# Patient Record
Sex: Female | Born: 1960 | Race: White | Hispanic: No | Smoking: Never smoker
Health system: Southern US, Community
[De-identification: ages and names within clinical notes are randomized; demographics above are authoritative.]

---

## 1999-04-12 ENCOUNTER — Other Ambulatory Visit: Admission: RE | Admit: 1999-04-12 | Discharge: 1999-04-12 | Payer: Self-pay | Admitting: Obstetrics and Gynecology

## 2000-06-19 ENCOUNTER — Other Ambulatory Visit: Admission: RE | Admit: 2000-06-19 | Discharge: 2000-06-19 | Payer: Self-pay | Admitting: Obstetrics and Gynecology

## 2001-07-06 ENCOUNTER — Other Ambulatory Visit: Admission: RE | Admit: 2001-07-06 | Discharge: 2001-07-06 | Payer: Self-pay | Admitting: Obstetrics and Gynecology

## 2002-07-15 ENCOUNTER — Encounter: Payer: Self-pay | Admitting: Obstetrics and Gynecology

## 2002-07-15 ENCOUNTER — Encounter: Admission: RE | Admit: 2002-07-15 | Discharge: 2002-07-15 | Payer: Self-pay | Admitting: Obstetrics and Gynecology

## 2002-07-19 ENCOUNTER — Other Ambulatory Visit: Admission: RE | Admit: 2002-07-19 | Discharge: 2002-07-19 | Payer: Self-pay | Admitting: Obstetrics and Gynecology

## 2002-07-22 ENCOUNTER — Ambulatory Visit (HOSPITAL_COMMUNITY): Admission: RE | Admit: 2002-07-22 | Discharge: 2002-07-22 | Payer: Self-pay | Admitting: Obstetrics and Gynecology

## 2002-07-22 ENCOUNTER — Encounter: Payer: Self-pay | Admitting: Obstetrics and Gynecology

## 2002-07-26 ENCOUNTER — Ambulatory Visit: Admission: RE | Admit: 2002-07-26 | Discharge: 2002-07-26 | Payer: Self-pay | Admitting: Gynecology

## 2002-08-09 ENCOUNTER — Encounter (INDEPENDENT_AMBULATORY_CARE_PROVIDER_SITE_OTHER): Payer: Self-pay

## 2002-08-09 ENCOUNTER — Encounter (INDEPENDENT_AMBULATORY_CARE_PROVIDER_SITE_OTHER): Payer: Self-pay | Admitting: Specialist

## 2002-08-09 ENCOUNTER — Inpatient Hospital Stay (HOSPITAL_COMMUNITY): Admission: RE | Admit: 2002-08-09 | Discharge: 2002-08-11 | Payer: Self-pay | Admitting: Obstetrics and Gynecology

## 2003-12-05 ENCOUNTER — Encounter: Admission: RE | Admit: 2003-12-05 | Discharge: 2003-12-05 | Payer: Self-pay | Admitting: Obstetrics and Gynecology

## 2003-12-08 ENCOUNTER — Other Ambulatory Visit: Admission: RE | Admit: 2003-12-08 | Discharge: 2003-12-08 | Payer: Self-pay | Admitting: Obstetrics and Gynecology

## 2005-04-25 ENCOUNTER — Encounter: Admission: RE | Admit: 2005-04-25 | Discharge: 2005-04-25 | Payer: Self-pay | Admitting: Obstetrics and Gynecology

## 2006-05-25 ENCOUNTER — Encounter: Admission: RE | Admit: 2006-05-25 | Discharge: 2006-05-25 | Payer: Self-pay | Admitting: Obstetrics and Gynecology

## 2007-07-12 ENCOUNTER — Encounter: Admission: RE | Admit: 2007-07-12 | Discharge: 2007-07-12 | Payer: Self-pay | Admitting: Obstetrics and Gynecology

## 2008-07-17 ENCOUNTER — Encounter: Admission: RE | Admit: 2008-07-17 | Discharge: 2008-07-17 | Payer: Self-pay | Admitting: Obstetrics and Gynecology

## 2009-08-09 ENCOUNTER — Encounter: Admission: RE | Admit: 2009-08-09 | Discharge: 2009-08-09 | Payer: Self-pay | Admitting: Obstetrics and Gynecology

## 2010-02-25 ENCOUNTER — Encounter: Payer: Self-pay | Admitting: Obstetrics and Gynecology

## 2010-03-01 ENCOUNTER — Other Ambulatory Visit: Payer: Self-pay | Admitting: Obstetrics and Gynecology

## 2010-03-01 DIAGNOSIS — M8580 Other specified disorders of bone density and structure, unspecified site: Secondary | ICD-10-CM

## 2010-03-01 DIAGNOSIS — Z1239 Encounter for other screening for malignant neoplasm of breast: Secondary | ICD-10-CM

## 2010-03-08 ENCOUNTER — Other Ambulatory Visit: Payer: Self-pay | Admitting: Obstetrics and Gynecology

## 2010-06-21 NOTE — Discharge Summary (Signed)
   NAME:  Leslie Freeman, Leslie Freeman                        ACCOUNT NO.:  [id number]   MEDICAL RECORD NO.:  [id number]                   PATIENT TYPE:  INP   LOCATION:  9106                                 FACILITY:  [facility]   PHYSICIAN:  Michelle L. Vincente Poli, M.D.            DATE OF BIRTH:  1960/06/21   DATE OF ADMISSION:  08/09/2002  DATE OF DISCHARGE:  08/11/2002                                 DISCHARGE SUMMARY   ADMISSION DIAGNOSES:  1. Fibroid uterus.  2. Pelvic mass.   POSTOPERATIVE DIAGNOSES:  1. Fibroid uterus.  2. Pelvic mass.   HISTORY OF PRESENT ILLNESS:  The patient is a 50 year old gravida 0 who  presents for TAH and possible BSO. The patient was noted to have a large  pelvic mass at the time of annual examination. CA125 was within normal  limits. CT scan showed a large pedunculated fibroid.   HOSPITAL COURSE:  On the day of surgery the patient went to the operating  room. At the time of laparotomy, she was found to have extensive adhesions  with extensive large myomatous uterus that was degenerating into the left  pelvic sidewall. She had a TAH, lysis of adhesions, and LSO. EBL at the time  of surgery was 1600 cc. The patient did very well postoperative.   LABORATORY DATA:  On postoperative day 1, hemoglobin was 9.7 and hematocrit  was 28.6. White blood cell count was 11.6.   DISPOSITION:  She was discharged home on postoperative day 2 in good  condition.   DISCHARGE MEDICATIONS:  She was given Ibuprofen 600 mg q. 6 to take as  needed for pain as well as Tylox to take 1-2 every 4-6 hours for pain.   DISCHARGE INSTRUCTIONS:  She was given advice not to drive for 1 week. To  call if she has nausea, vomiting, temperature greater than 100.5, redness or  drainage from the incision site.   FOLLOW UP:  She will followup in 1 week.                                               Michelle L. Vincente Poli, M.D.    Florestine Avers  D:  09/05/2002  T:  09/05/2002  Job:  [id number]

## 2010-06-21 NOTE — Op Note (Signed)
NAME:  Leslie Freeman, Leslie Freeman                        ACCOUNT NO.:  [id number]   MEDICAL RECORD NO.:  [id number]                   PATIENT TYPE:  INP   LOCATION:  9106                                 FACILITY:  [facility]   PHYSICIAN:  Michelle L. Vincente Poli, M.D.            DATE OF BIRTH:  1960/04/01   DATE OF PROCEDURE:  08/09/2002  DATE OF DISCHARGE:  08/11/2002                                 OPERATIVE REPORT   PREOPERATIVE DIAGNOSIS:  Symptomatic fibroid and large pelvic mass.   POSTOPERATIVE DIAGNOSIS:  Symptomatic fibroid and large pelvic mass.   PROCEDURE:  Exploratory laparotomy, lysis of adhesions, total abdominal  hysterectomy, and left salpingo-oophorectomy.   SURGEON:  Michelle L. Vincente Poli, M.D.   ASSISTANT:  Raynald Kemp, M.D.   ANESTHESIA:  General.   ESTIMATED BLOOD LOSS:  1600 mL.   DRAINS:  Foley.   PATHOLOGY:  Uterus, cervix, left tube, and ovary.   PROCEDURE:  The patient was taken to the operating room.  She was intubated  in the standard fashion without difficulty.  The abdomen was prepped and  draped in the usual sterile fashion.  A Foley catheter was inserted into the  bladder.  A sterile drape was applied to the abdomen.  A low transverse  incision was made, carried down to the fascia.  The fascia was scored in the  midline and extended laterally.  The rectus muscles were separated in the  midline.  The peritoneum was entered bluntly and the peritoneal incision was  then extended.  Pelvic washings were then performed, and abdominal pelvic  exploration was then performed, which revealed an extremely large uterus.  It encompassed the entire lower pelvis, and it went from one sidewall to the  next.  It also extended up to her umbilicus.  It appeared grossly consistent  with a very large fibroid.  There were some adhesions on the left side of  the bowel to the posterior wall of the uterus, and actually I believe those  adhesions had twisted the uterus so that the left  tube and ovary were on the  right side.  We then extended our incision laterally with dissecting the  rectus muscles partially to get some good lateral exposure and then replaced  the towel clamps onto the uterus.  We were unable to actually elevate the  uterus through the incision.  We proceeded with using Metzenbaums and  carefully dissecting the bowel free posteriorly.  There was some increased  mobility on the right side. on the right side of the pelvis.  The right tube  and ovary were visualized.  The triple pedicle was then clamped using the  Kelly clamp and specimen was cut using a suture ligature of 0 Vicryl suture.  We then did develop the bladder flap a little on the right side.  However,  we were unable to salvage the left tube and ovary because it was stretched  and splayed across this mass.  So using very careful clamps, Kelly clamps  were placed just beneath this fibroid on the left side to mobilize the broad  ligament.  The specimen was clamped, suture ligated using 0 Vicryl suture.   We then at this point after the lysis of adhesions, there was still  extremely limited mobility.  We were unable to actually get down and see her  uterines very well on the left side.  However, after dissecting the bladder  down on the right side, I was able to clamp the uterine artery at the level  of the internal os, and after the pedicle was cut, suture ligated using 0  Vicryl suture.   We decided to bivalve the uterus to collapse it because it was so wide.  That way we could get mobility laterally.  So using this scalpel, a large V-  shaped incision was made in the uterus from the fundus down to the mid  portion of the uterine body and a large portion of the center of the uterus  was then removed.  This allowed the uterus to be collapsed in the midline so  we could see laterally.  At this point we then developed our bladder flap on  the left side and a curved Heaney clamp was placed right  at the internal os  on the left side.  The specimen was then removed using a knife after the  fundus was amputated and the uterine artery pedicle was then suture ligated  using 0 Vicryl suture.  Because the fibroid had appeared to grow into the  wall and on the left side in the pelvic sidewall and showed signs of  degeneration, the ureter on the left side was extremely adherent to the  lateral wall of the uterus.  After we amputated the fundus and removed the  uterus, the ureter was identified and was very close to our specimen.  However, indigo carmine was given at this point and there was no spillage of  any blue dye intraperitoneally or retroperitoneal, and there was notation  that the urine turned blue.  There was also normal peristalsis of both  ureters, and neither ureter was dilated throughout the entire case.  We felt  very confident that our ureter was fine.   After the uterus was amputated, that portion of the specimen was sent for  frozen section and the pathologist's reading and interpretation of the  frozen section was benign leiomyoma with cystic degeneration.  After the  uterus was amputated, we then proceeded with removal of the cervix and we  dissected the bladder free from the anterior surface of the cervix, and then  using a series of straight Heaney clamps, the Heaney clamps were placed just  beside the cervix and the specimen was cut.  The pedicles were cut and  suture ligated using 0 Vicryl suture.  Once we worked ourselves down to the  external os, a curved Heaney clamp was placed just beneath it.  The specimen  was removed using the Satinsky scissors and the cuff was then closed in a  series of figure-of-eights using 0 Vicryl suture.  Irrigation was performed.  All pedicles were inspected.  Hemostasis was noted.  Again, our right tube  and ovary appeared normal, and both ureters were reinspected in their entirety and they were noted to be normal with normal  peristalsis and no  evidence of dilation.  All instruments were removed from the abdominal  cavity.  The peritoneum was closed using 0 Vicryl continuous running stitch.  The rectus muscles were reapproximated in the same 0 Vicryl.  The fascia was  closed using an 0 Vicryl suture in a continuous running suture starting at  each corner and meeting in the midline.  After irrigation of the  subcutaneous layer, the skin was closed with staples.  All sponge, lap, and  instrument counts were correct x 2.  The patient tolerated the procedure  well and went to the recovery room in stable condition.                                               Michelle L. Vincente Poli, M.D.    Florestine Avers  D:  08/11/2002  T:  08/11/2002  Job:  [id number]

## 2010-06-21 NOTE — Consult Note (Signed)
NAME:  Leslie Freeman, BARCIA                        ACCOUNT NO.:  [id number]   MEDICAL RECORD NO.:  [id number]                   PATIENT TYPE:  OUT   LOCATION:  GYN                                  FACILITY:  [facility]   PHYSICIAN:  De Blanch, M.D.         DATE OF BIRTH:  06-Aug-1960   DATE OF CONSULTATION:  07/26/2002  DATE OF DISCHARGE:                                   CONSULTATION   REASON FOR CONSULTATION:  The patient is a 50 year old white married female  seen in consultation at the request of Dr. Marcelle Overlie regarding newly  diagnosed pelvic mass.   The mass is relatively asymptomatic, the patient reporting that she  discovered it after she lost 55 pounds over the past year while  participating in a [employer] weight reduction program.  She has  regular cyclic menstrual periods every 25 days, she has no intermenstrual  spotting or postcoital bleeding, and she has no pelvic pain, pressure, or GI  or GU symptoms.   PAST MEDICAL HISTORY:  The patient has no significant gynecologic history.   OBSTETRICAL HISTORY:  Gravida 0.   PAST SURGICAL HISTORY:  None.   ALLERGIES:  None.   SOCIAL HISTORY:  The patient is married.  She does not smoke.  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   REVIEW OF SYSTEMS:  Negative, except as noted above.   PHYSICAL EXAMINATION:  VITAL SIGNS:  Weight 168 pounds, height 5 feet 8  inches.  Blood pressure 122/76, pulse 98, respirations 18.  GENERAL:  The patient is a healthy young woman in no acute distress.  HEENT:  Negative.  NECK:  Supple without thyromegaly.  There is no supraclavicular or inguinal  adenopathy.  ABDOMEN:  Soft and nontender.  She has a mass extending from the pelvis up  to approximately the umbilicus.  This is firm and nontender.  No ascites is  noted.  PELVIC:  EGBUS, vagina, bladder, and urethra are normal.  Cervix is normal.  Bimanual examination reveals an enlarged uterus approximately [redacted] weeks  gestational size.  This is moderately mobile and nontender.  No adnexal  masses are palpable.   LABORATORY DATA:  Laboratory work reviewed.  CA-125 8.7.  CT scan obtained  on June 18k 2004, showed no evidence for abnormality in the abdomen.  In the  pelvis, there is a mass on the central pelvis measuring 13 x 11 x 14 cm.  It  has blood flow that is contiguous with the uterus, making it likely that  this is a pedunculated uterine fibroid.  Both ovaries were identified  separate from the mass.   IMPRESSION:  Probable uterine fibroids which are relatively asymptomatic.  Nonetheless, given their size, I recommend the patient undergo a total  abdominal hysterectomy.  If her ovaries appear normal, I believe they could  be spared.  We did discuss myomectomy, but in the end the patient does not  wish to have any children, and therefore, hysterectomy would seem to be the  most appropriate management option.   PLAN:  I will discuss the patient's case with Dr. Vincente Poli.  I honestly  believe that these are fibroids and that Dr. Vincente Poli could proceed with  surgery as is convenient to her schedule.                                               De Blanch, M.D.    DC/MEDQ  D:  07/26/2002  T:  07/26/2002  Job:  [id number]   cc:   Marcelino Duster L. Vincente Poli, M.D.  [address]  [place]  [place] [address]  Fax: [phone]   Telford Nab, R.N.

## 2010-09-06 ENCOUNTER — Other Ambulatory Visit: Payer: Self-pay | Admitting: Obstetrics and Gynecology

## 2010-09-06 DIAGNOSIS — Z1231 Encounter for screening mammogram for malignant neoplasm of breast: Secondary | ICD-10-CM

## 2010-09-09 ENCOUNTER — Ambulatory Visit
Admission: RE | Admit: 2010-09-09 | Discharge: 2010-09-09 | Disposition: A | Discharge status: Home / Self Care | Payer: No Typology Code available for payment source | Source: Ambulatory Visit | Attending: Obstetrics and Gynecology | Admitting: Obstetrics and Gynecology

## 2010-09-09 DIAGNOSIS — Z1231 Encounter for screening mammogram for malignant neoplasm of breast: Secondary | ICD-10-CM

## 2011-03-06 ENCOUNTER — Ambulatory Visit
Admission: RE | Admit: 2011-03-06 | Discharge: 2011-03-06 | Disposition: A | Discharge status: Home / Self Care | Payer: No Typology Code available for payment source | Source: Ambulatory Visit | Attending: Emergency Medicine | Admitting: Emergency Medicine

## 2011-03-06 ENCOUNTER — Ambulatory Visit (INDEPENDENT_AMBULATORY_CARE_PROVIDER_SITE_OTHER): Payer: No Typology Code available for payment source | Admitting: Emergency Medicine

## 2011-03-06 ENCOUNTER — Encounter: Payer: Self-pay | Admitting: Emergency Medicine

## 2011-03-06 VITALS — BP 119/85 | HR 105 | Temp 98.5°F | Resp 18 | Ht 68.0 in | Wt 186.0 lb

## 2011-03-06 DIAGNOSIS — G510 Bell's palsy: Secondary | ICD-10-CM

## 2011-03-06 DIAGNOSIS — E119 Type 2 diabetes mellitus without complications: Secondary | ICD-10-CM | POA: Insufficient documentation

## 2011-03-06 MED ORDER — PREDNISONE 20 MG PO TABS: ORAL_TABLET | ORAL | Status: DC

## 2011-03-06 MED ORDER — ARTIFICIAL TEARS OP OINT: TOPICAL_OINTMENT | OPHTHALMIC | Status: DC

## 2011-03-06 MED ORDER — VALACYCLOVIR HCL 1 G PO TABS: 1000.0000 mg | ORAL_TABLET | Freq: Two times a day (BID) | ORAL | Status: AC

## 2011-03-06 NOTE — Patient Instructions (Addendum)
Bell's Palsy Bell's palsy is a condition in which the muscles on one side of the face cannot move (paralysis). This is because the nerves in the face are paralyzed. It is most often thought to be caused by a virus. The virus causes swelling of the nerve that controls movement on one side of the face. The nerve travels through a tight space surrounded by bone. When the nerve swells, it can be compressed by the bone. This results in damage to the protective covering around the nerve. This damage interferes with how the nerve communicates with the muscles of the face. As a result, it can cause weakness or paralysis of the facial muscles.  Injury (trauma), tumor, and surgery may cause Bell's palsy, but most of the time the cause is unknown. It is a relatively common condition. It starts suddenly (abrupt onset) with the paralysis usually ending within 2 days. Bell's palsy is not dangerous. But because the eye does not close properly, you may need care to keep the eye from getting dry. This can include splinting (to keep the eye shut) or moistening with artificial tears. Bell's palsy very seldom occurs on both sides of the face at the same time. SYMPTOMS   Eyebrow sagging.   Drooping of the eyelid and corner of the mouth.   Inability to close one eye.   Loss of taste on the front of the tongue.   Sensitivity to loud noises.  TREATMENT  The treatment is usually non-surgical. If the patient is seen within the first 24 to 48 hours, a short course of steroids may be prescribed, in an attempt to shorten the length of the condition. Antiviral medicines may also be used with the steroids, but it is unclear if they are helpful.  You will need to protect your eye, if you cannot close it. The cornea (clear covering over your eye) will become dry and can be damaged. Artificial tears can be used to keep your eye moist. Glasses or an eye patch should be worn to protect your eye. PROGNOSIS  Recovery is variable,  ranging from days to months. Although the problem usually goes away completely (about 80% of cases resolve), predicting the outcome is impossible. Most people improve within 3 weeks of when the symptoms began. Improvement may continue for 3 to 6 months. A small number of people have moderate to severe weakness that is permanent.  HOME CARE INSTRUCTIONS   If your caregiver prescribed medication to reduce swelling in the nerve, use as directed. Do not stop taking the medication unless directed by your caregiver.   Use moisturizing eye drops as needed to prevent drying of your eye, as directed by your caregiver.   Protect your eye, as directed by your caregiver.   Use facial massage and exercises, as directed by your caregiver.   Perform your normal activities, and get your normal rest.  SEEK IMMEDIATE MEDICAL CARE IF:   There is pain, redness or irritation in the eye.   You or your child has an oral temperature above 102 F (38.9 C), not controlled by medicine.  MAKE SURE YOU:   Understand these instructions.   Will watch your condition.   Will get help right away if you are not doing well or get worse.  Document Released: 01/20/2005 Document Revised: 10/02/2010 Document Reviewed: 01/29/2009 [employer] Patient Information 2012 [employer]. 

## 2011-03-06 NOTE — Progress Notes (Addendum)
  Subjective:    Patient ID: Leslie Freeman, female    DOB: 11-13-1960, 51 y.o.   MRN: [id number]  HPI  Patient is a 51 year old female with history of diabetes.  She comes in complaining of weakness and numbness on the left side of her face.  She was worried that she was having a stroke.  The patient claims that at 7:20 this morning she was brushing her teeth and noticed herself drooling out of the left side of her mouth.  While looking in the mirror she also noticed that she could not properly smile.  The patient works at the [employer] of [place] and Dr. Mayford Knife recommended that she seek medical attention to rule out a stroke.    Review of Systems  HENT: Negative for hearing loss, ear pain, facial swelling, neck pain, neck stiffness and ear discharge.   Eyes: Negative for visual disturbance.  Cardiovascular: Negative for chest pain and palpitations.  Neurological: Positive for facial asymmetry, weakness and numbness. Negative for dizziness, tremors, syncope, speech difficulty, light-headedness and headaches.  Psychiatric/Behavioral: Negative for behavioral problems and confusion.   No speech abnormalities No diplopia or visual disturbances  Numbness on inside of left cheek      Objective:   Physical Exam  HENT:  Head: Normocephalic.  Right Ear: External ear normal.  Mouth/Throat: Oropharynx is clear and moist.  Eyes: Conjunctivae are normal. Pupils are equal, round, and reactive to light.  Neck: Normal range of motion.  Cardiovascular: Normal rate and normal heart sounds.   Pulmonary/Chest: Effort normal and breath sounds normal.  Neurological: She has normal reflexes. She displays normal reflexes. A cranial nerve deficit is present. She exhibits normal muscle tone. Coordination normal.       Left facial paralysis.  Abnormal taste sensation, left side of tongue.  Other cranial nerves appear normal.     CT normal       Assessment & Plan:  Bells Palsy (rule out stroke).   Will proceed with CT head, no contrast.  Working on approval.

## 2011-03-09 ENCOUNTER — Ambulatory Visit (INDEPENDENT_AMBULATORY_CARE_PROVIDER_SITE_OTHER): Payer: No Typology Code available for payment source | Admitting: Emergency Medicine

## 2011-03-09 VITALS — BP 120/78 | HR 97 | Temp 98.3°F | Resp 16 | Ht 67.0 in | Wt 185.6 lb

## 2011-03-09 DIAGNOSIS — G510 Bell's palsy: Secondary | ICD-10-CM

## 2011-03-09 NOTE — Progress Notes (Signed)
  Subjective:    Patient ID: Leslie Freeman, female    DOB: 1960/06/30, 51 y.o.   MRN: [id number]  HPI patient presents for followup of Bell's palsy. She is tolerating her prednisone without elevation in her blood sugar. She has not had any major problems with closure of the left upper lid. She seems to be tolerating the medications prednisone and ganciclovir without difficulty.    Review of Systems overall sugar diabetes is doing well.     Objective:   Physical Exam physical exam is limited to the neurological system which reveals a left seventh nerve palsy. There appears to be some movement of the left fore head. No other focal neurological sign        Assessment & Plan:  Plan is for the patient to continue her medications. She will continues Lacri-Lube to keep her eye moist. She is to return to clinic immediately for any worsening neurological symptoms. If she continues to do well plan recheck in 2 weeks get a better idea about the speed of improvement.

## 2011-03-09 NOTE — Patient Instructions (Signed)
Patient will return to clinic for any worsening of neurological symptoms we'll recheck in 2 weeks and check on progress of her improvement

## 2011-03-22 ENCOUNTER — Ambulatory Visit (INDEPENDENT_AMBULATORY_CARE_PROVIDER_SITE_OTHER): Payer: No Typology Code available for payment source | Admitting: Emergency Medicine

## 2011-03-22 VITALS — BP 105/63 | HR 68 | Temp 99.1°F | Resp 16 | Ht 67.5 in | Wt 182.0 lb

## 2011-03-22 DIAGNOSIS — G510 Bell's palsy: Secondary | ICD-10-CM

## 2011-03-22 DIAGNOSIS — G44209 Tension-type headache, unspecified, not intractable: Secondary | ICD-10-CM

## 2011-03-22 NOTE — Progress Notes (Signed)
  Subjective:    Patient ID: Leslie Freeman, female    DOB: May 29, 1960, 51 y.o.   MRN: [id number]  HPI patient is here to followup Bell's palsy. Initially she had CT scan of her head and this was within normal limits. She was treated with prednisone and Valtrex with good response. She continues to use drops in her. Since her last visit she has had marked improvement in her facial muscle function    Review of Systems she has had no other neurological symptoms.     Objective:   Physical Exam on neurological examination she has had marked improvement in her ability to wrinkle her forehead. She has had an approximately 50% return of her ability to smile. She is now able to purse her lips. She has almost complete closure of her left upper lip.        Assessment & Plan:  Assessment is of a left Bell's palsy. Plan recheck in about one month.

## 2011-04-19 ENCOUNTER — Ambulatory Visit (INDEPENDENT_AMBULATORY_CARE_PROVIDER_SITE_OTHER): Payer: No Typology Code available for payment source | Admitting: Emergency Medicine

## 2011-04-19 VITALS — BP 115/74 | HR 66 | Temp 98.4°F | Resp 16 | Ht 67.5 in | Wt 176.6 lb

## 2011-04-19 DIAGNOSIS — G510 Bell's palsy: Secondary | ICD-10-CM

## 2011-04-19 NOTE — Progress Notes (Signed)
  Subjective:    Patient ID: Leslie Freeman, female    DOB: 08/31/1960, 51 y.o.   MRN: [id number]  HPI patient here to follow up Bell's palsy. She's been incredibly better. He is now able to close her eye completely. Her smile is returned to being symmetrical.    Review of Systems review of systems is noncontributory.     Objective:   Physical Exam physical exam of the cranial nerves of the face revealed completely symmetrical facial expression. She has normal pursing of her lips. She has normal eye closure.        Assessment & Plan:  Assessment is Bell's palsy. Total resolution of symptoms .

## 2011-10-20 ENCOUNTER — Other Ambulatory Visit: Payer: Self-pay | Admitting: Obstetrics and Gynecology

## 2011-10-20 DIAGNOSIS — Z1231 Encounter for screening mammogram for malignant neoplasm of breast: Secondary | ICD-10-CM

## 2011-11-03 ENCOUNTER — Ambulatory Visit: Payer: No Typology Code available for payment source | Admitting: Family Medicine

## 2011-11-03 VITALS — BP 112/74 | HR 120 | Temp 98.1°F | Resp 18 | Ht 67.0 in | Wt 175.0 lb

## 2011-11-03 DIAGNOSIS — G510 Bell's palsy: Secondary | ICD-10-CM

## 2011-11-03 DIAGNOSIS — E119 Type 2 diabetes mellitus without complications: Secondary | ICD-10-CM

## 2011-11-03 MED ORDER — PREDNISONE 20 MG PO TABS: ORAL_TABLET | ORAL | Status: DC

## 2011-11-03 MED ORDER — ARTIFICIAL TEARS OP OINT: TOPICAL_OINTMENT | OPHTHALMIC | Status: DC

## 2011-11-03 MED ORDER — VALACYCLOVIR HCL 1 G PO TABS: 1000.0000 mg | ORAL_TABLET | Freq: Three times a day (TID) | ORAL | Status: DC

## 2011-11-03 NOTE — Progress Notes (Signed)
  Subjective:    Patient ID: Leslie Freeman, female    DOB: September 02, 1960, 51 y.o.   MRN: [id number]  HPI   Pt was in [place] this past wk and on Thurs she noticed that she was drooling out of the right side of her mouth. She has since had progressive loss of function to the right side of her face w/ eye not blinking and right mouth drop.  Slight numbness over her right cheek. She had a prior episode of Bell's palsy on the left side of her face about 8 mos prior which fully resolved w/ valtrex and prednisone after about 6 wks. Her Dm is under excellent diet control.  She has been under a lot of stress lately as her husband lost his job. After her initial bells palsy trx   Review of Systems  Constitutional: Negative for fever, chills, diaphoresis, activity change, appetite change, fatigue and unexpected weight change.  HENT: Positive for rhinorrhea and drooling. Negative for hearing loss, ear pain, nosebleeds, congestion, sore throat, facial swelling, sneezing, mouth sores, trouble swallowing, neck pain, neck stiffness, dental problem, voice change, postnasal drip, sinus pressure, tinnitus and ear discharge.   Respiratory: Negative for shortness of breath.   Cardiovascular: Negative for chest pain.  Musculoskeletal: Negative for myalgias, joint swelling and gait problem.  Skin: Negative for color change, rash and wound.  Neurological: Positive for facial asymmetry and numbness. Negative for dizziness, tremors, syncope, speech difficulty, weakness, light-headedness and headaches.  Hematological: Negative for adenopathy. Does not bruise/bleed easily.  Psychiatric/Behavioral: Negative for disturbed wake/sleep cycle.       Objective:   Physical Exam        Assessment & Plan:  1. Bells palsy - Restart prednisone and valtrex trx. Cont night eye tap and freq lacrilube optho oint. 2. DM - Pt will watch diet and sugars closely while on prednisone and call if elev

## 2011-11-03 NOTE — Patient Instructions (Signed)
Bell's Palsy  Bell's palsy is a condition in which the muscles on one side of the face cannot move (paralysis). This is because the nerves in the face are paralyzed. It is most often thought to be caused by a virus. The virus causes swelling of the nerve that controls movement on one side of the face. The nerve travels through a tight space surrounded by bone. When the nerve swells, it can be compressed by the bone. This results in damage to the protective covering around the nerve. This damage interferes with how the nerve communicates with the muscles of the face. As a result, it can cause weakness or paralysis of the facial muscles.   Injury (trauma), tumor, and surgery may cause Bell's palsy, but most of the time the cause is unknown. It is a relatively common condition. It starts suddenly (abrupt onset) with the paralysis usually ending within 2 days. Bell's palsy is not dangerous. But because the eye does not close properly, you may need care to keep the eye from getting dry. This can include splinting (to keep the eye shut) or moistening with artificial tears. Bell's palsy very seldom occurs on both sides of the face at the same time.  SYMPTOMS    Eyebrow sagging.   Drooping of the eyelid and corner of the mouth.   Inability to close one eye.   Loss of taste on the front of the tongue.   Sensitivity to loud noises.  TREATMENT   The treatment is usually non-surgical. If the patient is seen within the first 24 to 48 hours, a short course of steroids may be prescribed, in an attempt to shorten the length of the condition. Antiviral medicines may also be used with the steroids, but it is unclear if they are helpful.   You will need to protect your eye, if you cannot close it. The cornea (clear covering over your eye) will become dry and can be damaged. Artificial tears can be used to keep your eye moist. Glasses or an eye patch should be worn to protect your eye.  PROGNOSIS   Recovery is variable, ranging  from days to months. Although the problem usually goes away completely (about 80% of cases resolve), predicting the outcome is impossible. Most people improve within 3 weeks of when the symptoms began. Improvement may continue for 3 to 6 months. A small number of people have moderate to severe weakness that is permanent.   HOME CARE INSTRUCTIONS    If your caregiver prescribed medication to reduce swelling in the nerve, use as directed. Do not stop taking the medication unless directed by your caregiver.   Use moisturizing eye drops as needed to prevent drying of your eye, as directed by your caregiver.   Protect your eye, as directed by your caregiver.   Use facial massage and exercises, as directed by your caregiver.   Perform your normal activities, and get your normal rest.  SEEK IMMEDIATE MEDICAL CARE IF:    There is pain, redness or irritation in the eye.   You or your child has an oral temperature above 102 F (38.9 C), not controlled by medicine.  MAKE SURE YOU:    Understand these instructions.   Will watch your condition.   Will get help right away if you are not doing well or get worse.  Document Released: 01/20/2005 Document Revised: 01/09/2011 Document Reviewed: 01/29/2009  [employer] Patient Information 2012 [employer].

## 2011-11-17 ENCOUNTER — Ambulatory Visit
Admission: RE | Admit: 2011-11-17 | Discharge: 2011-11-17 | Disposition: A | Discharge status: Home / Self Care | Payer: No Typology Code available for payment source | Source: Ambulatory Visit | Attending: Obstetrics and Gynecology | Admitting: Obstetrics and Gynecology

## 2011-11-17 DIAGNOSIS — Z1231 Encounter for screening mammogram for malignant neoplasm of breast: Secondary | ICD-10-CM

## 2011-12-10 ENCOUNTER — Other Ambulatory Visit: Payer: Self-pay | Admitting: Obstetrics and Gynecology

## 2012-12-14 ENCOUNTER — Ambulatory Visit
Admission: RE | Admit: 2012-12-14 | Discharge: 2012-12-14 | Disposition: A | Discharge status: Home / Self Care | Payer: No Typology Code available for payment source | Source: Ambulatory Visit | Attending: Obstetrics and Gynecology | Admitting: Obstetrics and Gynecology

## 2012-12-14 ENCOUNTER — Other Ambulatory Visit: Payer: Self-pay | Admitting: Obstetrics and Gynecology

## 2012-12-14 DIAGNOSIS — Z1231 Encounter for screening mammogram for malignant neoplasm of breast: Secondary | ICD-10-CM

## 2012-12-20 ENCOUNTER — Other Ambulatory Visit: Payer: Self-pay | Admitting: Obstetrics and Gynecology

## 2013-11-29 ENCOUNTER — Other Ambulatory Visit: Payer: Self-pay | Admitting: Family Medicine

## 2014-02-13 ENCOUNTER — Ambulatory Visit
Admission: RE | Admit: 2014-02-13 | Discharge: 2014-02-13 | Disposition: A | Discharge status: Home / Self Care | Payer: PRIVATE HEALTH INSURANCE | Source: Ambulatory Visit | Attending: Obstetrics and Gynecology | Admitting: Obstetrics and Gynecology

## 2014-02-13 ENCOUNTER — Other Ambulatory Visit: Payer: Self-pay | Admitting: Obstetrics and Gynecology

## 2014-02-13 DIAGNOSIS — Z1231 Encounter for screening mammogram for malignant neoplasm of breast: Secondary | ICD-10-CM

## 2014-06-08 ENCOUNTER — Other Ambulatory Visit (HOSPITAL_COMMUNITY): Payer: Self-pay | Admitting: Obstetrics and Gynecology

## 2014-06-09 LAB — CYTOLOGY - PAP

## 2015-05-29 ENCOUNTER — Ambulatory Visit
Admission: RE | Admit: 2015-05-29 | Discharge: 2015-05-29 | Disposition: A | Discharge status: Home / Self Care | Payer: PRIVATE HEALTH INSURANCE | Source: Ambulatory Visit | Attending: Obstetrics and Gynecology | Admitting: Obstetrics and Gynecology

## 2015-05-29 ENCOUNTER — Other Ambulatory Visit: Payer: Self-pay | Admitting: Obstetrics and Gynecology

## 2015-05-29 DIAGNOSIS — Z1231 Encounter for screening mammogram for malignant neoplasm of breast: Secondary | ICD-10-CM

## 2015-05-30 ENCOUNTER — Encounter (INDEPENDENT_AMBULATORY_CARE_PROVIDER_SITE_OTHER): Payer: Self-pay

## 2016-07-17 ENCOUNTER — Other Ambulatory Visit: Payer: Self-pay | Admitting: Obstetrics and Gynecology

## 2016-07-17 ENCOUNTER — Ambulatory Visit
Admission: RE | Admit: 2016-07-17 | Discharge: 2016-07-17 | Disposition: A | Discharge status: Home / Self Care | Payer: PRIVATE HEALTH INSURANCE | Source: Ambulatory Visit | Attending: Obstetrics and Gynecology | Admitting: Obstetrics and Gynecology

## 2016-07-17 DIAGNOSIS — Z1231 Encounter for screening mammogram for malignant neoplasm of breast: Secondary | ICD-10-CM

## 2017-09-11 ENCOUNTER — Other Ambulatory Visit: Payer: Self-pay | Admitting: Obstetrics and Gynecology

## 2017-09-11 ENCOUNTER — Ambulatory Visit
Admission: RE | Admit: 2017-09-11 | Discharge: 2017-09-11 | Disposition: A | Discharge status: Home / Self Care | Payer: PRIVATE HEALTH INSURANCE | Source: Ambulatory Visit | Attending: Obstetrics and Gynecology | Admitting: Obstetrics and Gynecology

## 2017-09-11 DIAGNOSIS — Z1231 Encounter for screening mammogram for malignant neoplasm of breast: Secondary | ICD-10-CM

## 2018-11-17 ENCOUNTER — Other Ambulatory Visit: Payer: Self-pay

## 2018-11-17 ENCOUNTER — Ambulatory Visit
Admission: RE | Admit: 2018-11-17 | Discharge: 2018-11-17 | Disposition: A | Discharge status: Home / Self Care | Payer: PRIVATE HEALTH INSURANCE | Source: Ambulatory Visit | Attending: Obstetrics and Gynecology | Admitting: Obstetrics and Gynecology

## 2018-11-17 ENCOUNTER — Other Ambulatory Visit: Payer: Self-pay | Admitting: Obstetrics and Gynecology

## 2018-11-17 DIAGNOSIS — Z1231 Encounter for screening mammogram for malignant neoplasm of breast: Secondary | ICD-10-CM

## 2019-10-30 IMAGING — MG DIGITAL SCREENING BILAT W/ TOMO W/ CAD
8 series · 8 of 24 positions shown · non-contrast
Comparison: Previous exam(s).

CLINICAL DATA: Screening.

EXAM:
DIGITAL SCREENING BILATERAL MAMMOGRAM WITH TOMO AND CAD

[R CC synth-2D]
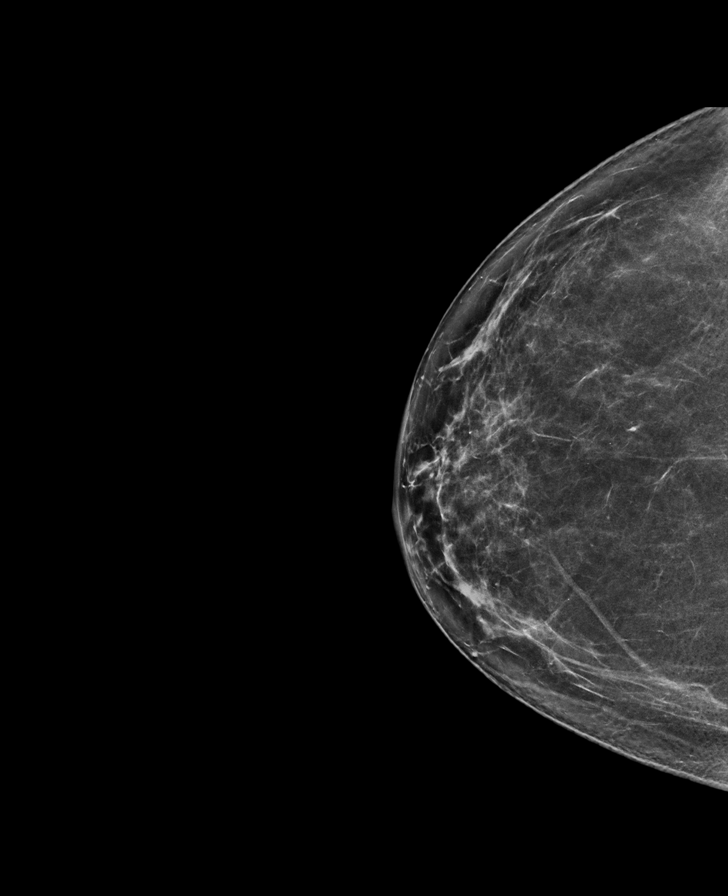

[L MLO synth-2D]
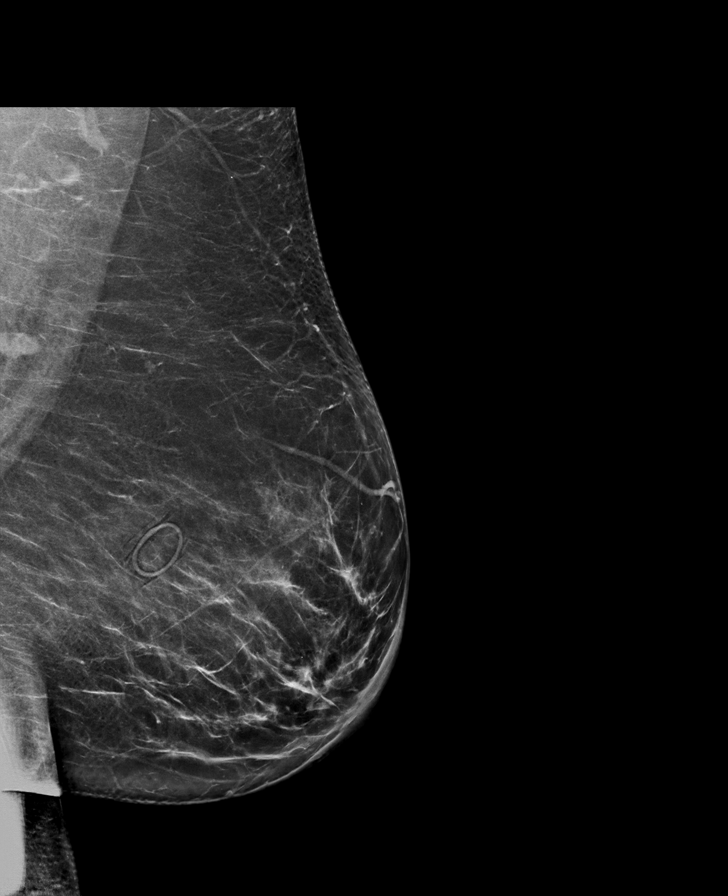

[R MLO synth-2D]
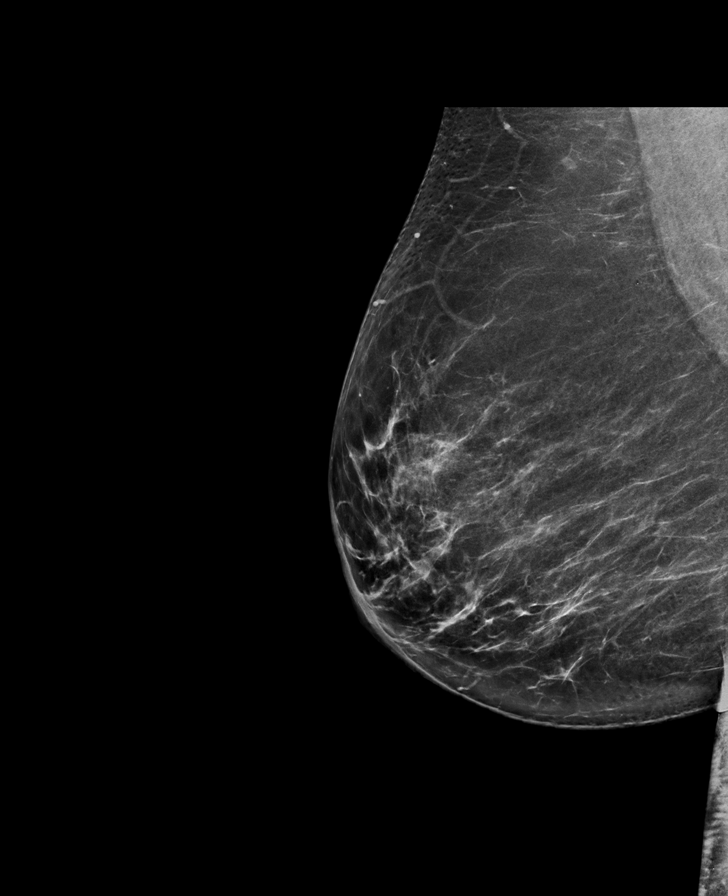

[L CC synth-2D]
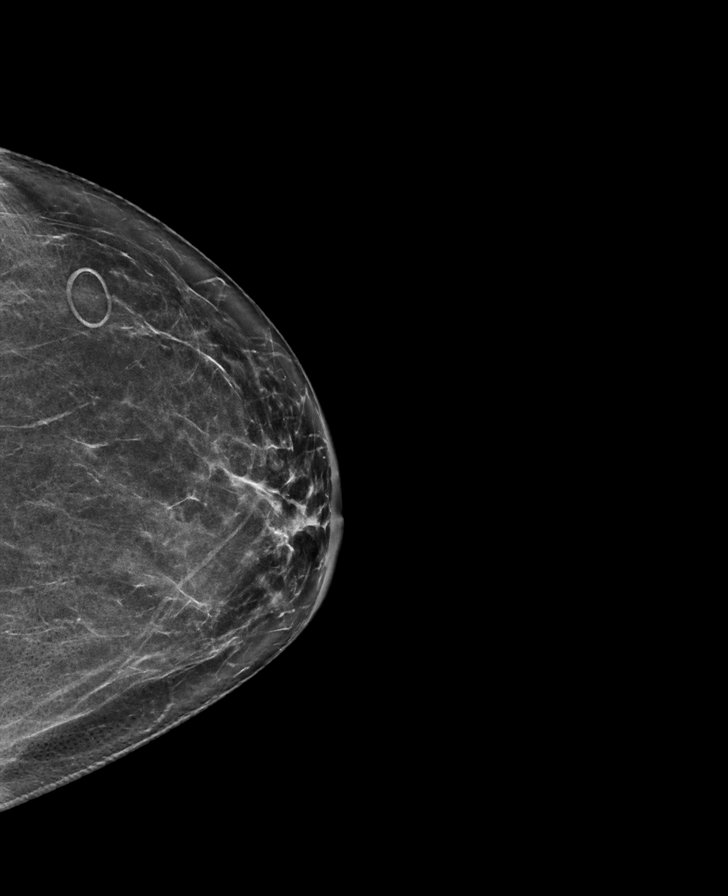

[R CC tomo · tomo slice 38/75.0]
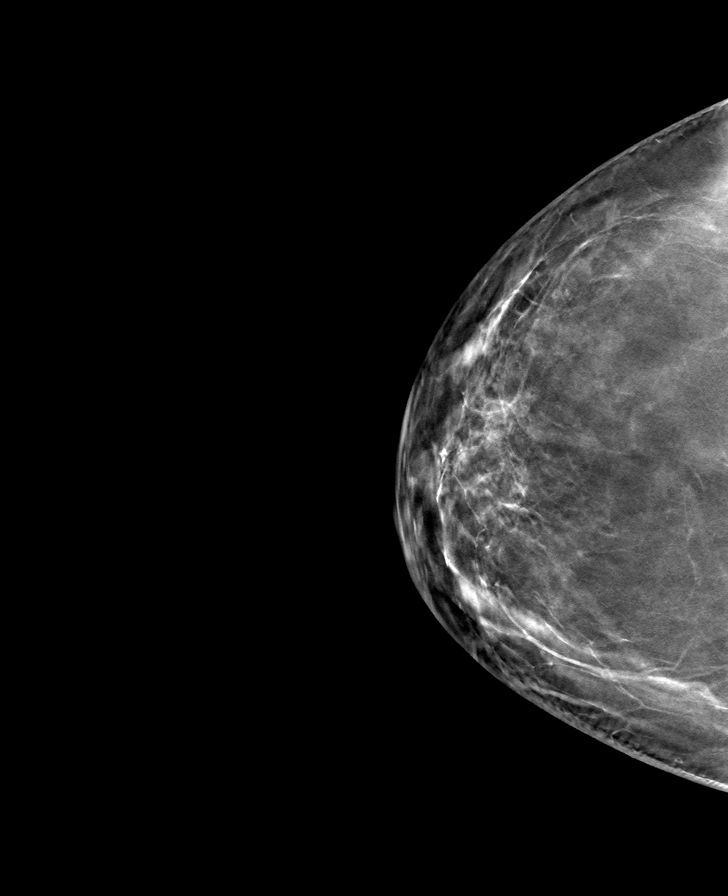

[L MLO tomo · tomo slice 42/83.0]
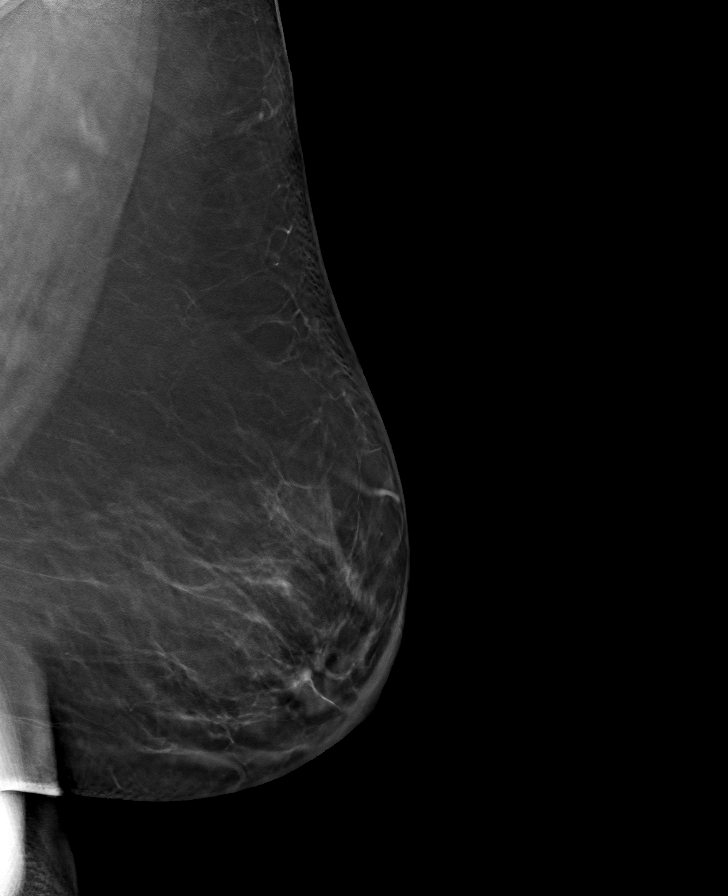

[L CC tomo · tomo slice 38/75.0]
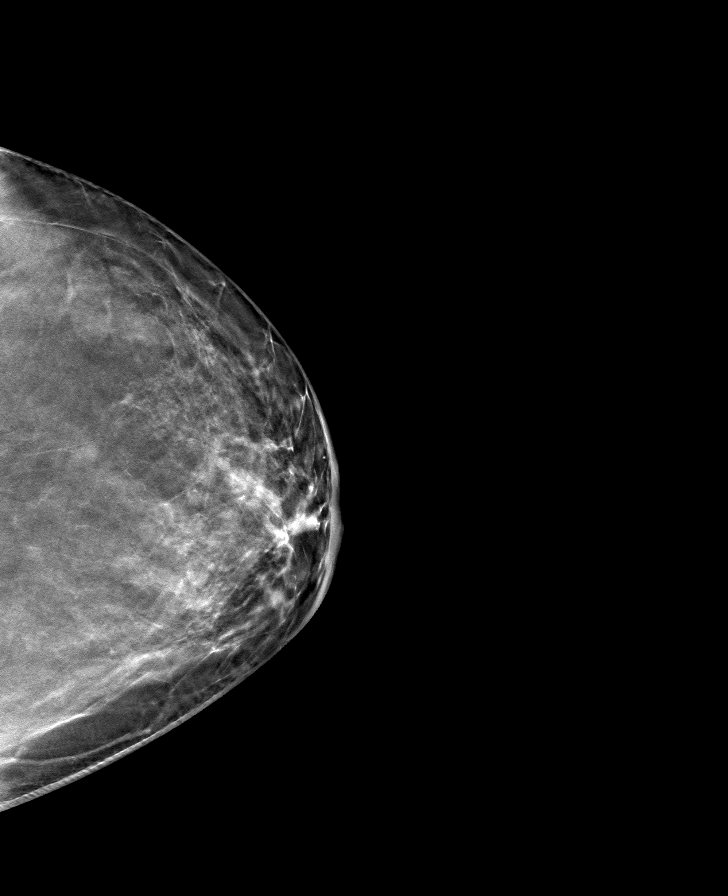

[R MLO tomo · tomo slice 41/82.0]
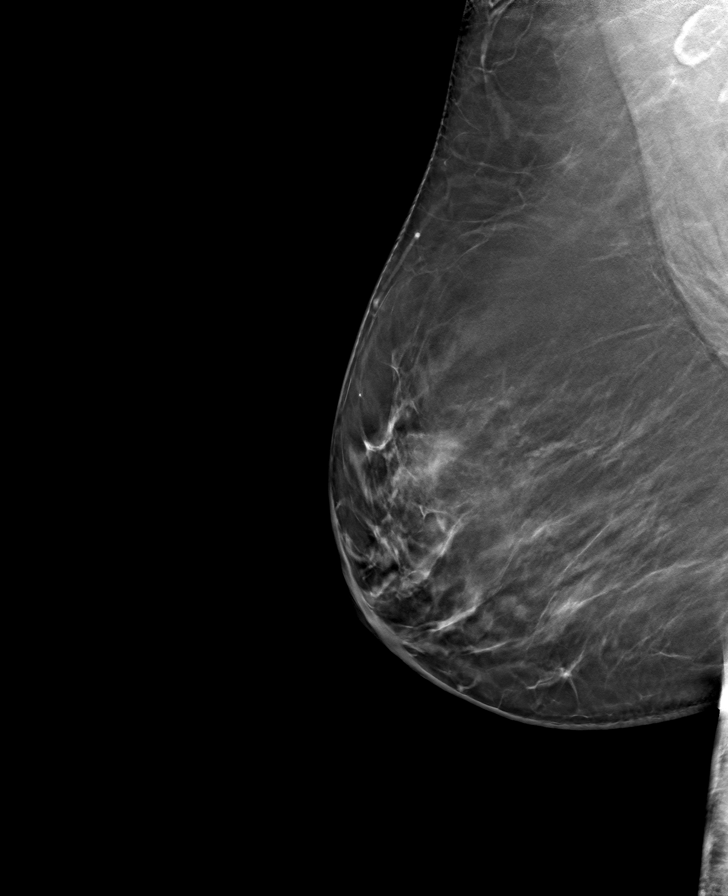

[8 of 24 positions shown; findings below may reference images not displayed]

ACR Breast Density Category b: There are scattered areas of
fibroglandular density.
FINDINGS: There are no findings suspicious for malignancy. Images were
processed with CAD.
IMPRESSION: No mammographic evidence of malignancy. A result letter of this
screening mammogram will be mailed directly to the patient.

RECOMMENDATION:
Screening mammogram in one year. (Code:[id number])

BI-RADS CATEGORY  1: Negative.

## 2021-05-29 DIAGNOSIS — E1165 Type 2 diabetes mellitus with hyperglycemia: Secondary | ICD-10-CM | POA: Diagnosis not present

## 2021-05-29 DIAGNOSIS — E7800 Pure hypercholesterolemia, unspecified: Secondary | ICD-10-CM | POA: Diagnosis not present

## 2021-05-29 DIAGNOSIS — R000 Tachycardia, unspecified: Secondary | ICD-10-CM | POA: Diagnosis not present

## 2021-06-04 DIAGNOSIS — E7800 Pure hypercholesterolemia, unspecified: Secondary | ICD-10-CM | POA: Diagnosis not present

## 2021-06-04 DIAGNOSIS — E1165 Type 2 diabetes mellitus with hyperglycemia: Secondary | ICD-10-CM | POA: Diagnosis not present

## 2021-06-04 DIAGNOSIS — R030 Elevated blood-pressure reading, without diagnosis of hypertension: Secondary | ICD-10-CM | POA: Diagnosis not present

## 2021-12-10 DIAGNOSIS — E7800 Pure hypercholesterolemia, unspecified: Secondary | ICD-10-CM | POA: Diagnosis not present

## 2021-12-10 DIAGNOSIS — E1165 Type 2 diabetes mellitus with hyperglycemia: Secondary | ICD-10-CM | POA: Diagnosis not present

## 2021-12-17 DIAGNOSIS — R030 Elevated blood-pressure reading, without diagnosis of hypertension: Secondary | ICD-10-CM | POA: Diagnosis not present

## 2021-12-17 DIAGNOSIS — E1165 Type 2 diabetes mellitus with hyperglycemia: Secondary | ICD-10-CM | POA: Diagnosis not present

## 2021-12-17 DIAGNOSIS — E7800 Pure hypercholesterolemia, unspecified: Secondary | ICD-10-CM | POA: Diagnosis not present

## 2022-04-07 DIAGNOSIS — Z6827 Body mass index (BMI) 27.0-27.9, adult: Secondary | ICD-10-CM | POA: Diagnosis not present

## 2022-04-07 DIAGNOSIS — Z1231 Encounter for screening mammogram for malignant neoplasm of breast: Secondary | ICD-10-CM | POA: Diagnosis not present

## 2022-04-07 DIAGNOSIS — Z01419 Encounter for gynecological examination (general) (routine) without abnormal findings: Secondary | ICD-10-CM | POA: Diagnosis not present

## 2022-04-24 DIAGNOSIS — Z1211 Encounter for screening for malignant neoplasm of colon: Secondary | ICD-10-CM | POA: Diagnosis not present

## 2022-04-30 LAB — EXTERNAL GENERIC LAB PROCEDURE: COLOGUARD: NEGATIVE

## 2022-04-30 LAB — COLOGUARD: COLOGUARD: NEGATIVE

## 2022-05-02 DIAGNOSIS — E7800 Pure hypercholesterolemia, unspecified: Secondary | ICD-10-CM | POA: Diagnosis not present

## 2022-05-02 DIAGNOSIS — E1165 Type 2 diabetes mellitus with hyperglycemia: Secondary | ICD-10-CM | POA: Diagnosis not present

## 2022-05-12 DIAGNOSIS — E7800 Pure hypercholesterolemia, unspecified: Secondary | ICD-10-CM | POA: Diagnosis not present

## 2022-05-12 DIAGNOSIS — E1165 Type 2 diabetes mellitus with hyperglycemia: Secondary | ICD-10-CM | POA: Diagnosis not present

## 2022-05-12 DIAGNOSIS — R030 Elevated blood-pressure reading, without diagnosis of hypertension: Secondary | ICD-10-CM | POA: Diagnosis not present

## 2022-05-23 ENCOUNTER — Other Ambulatory Visit: Payer: Self-pay | Admitting: Endocrinology

## 2022-05-23 DIAGNOSIS — E049 Nontoxic goiter, unspecified: Secondary | ICD-10-CM

## 2022-06-05 ENCOUNTER — Encounter: Payer: Self-pay | Admitting: Cardiology

## 2022-06-05 ENCOUNTER — Ambulatory Visit: Payer: 59 | Attending: Cardiology | Admitting: Cardiology

## 2022-06-05 ENCOUNTER — Ambulatory Visit: Payer: PRIVATE HEALTH INSURANCE | Attending: Cardiology

## 2022-06-05 VITALS — BP 118/64 | HR 128 | Ht 67.0 in | Wt 174.2 lb

## 2022-06-05 DIAGNOSIS — R011 Cardiac murmur, unspecified: Secondary | ICD-10-CM | POA: Diagnosis not present

## 2022-06-05 DIAGNOSIS — E785 Hyperlipidemia, unspecified: Secondary | ICD-10-CM

## 2022-06-05 DIAGNOSIS — R000 Tachycardia, unspecified: Secondary | ICD-10-CM

## 2022-06-05 NOTE — Progress Notes (Unsigned)
Enrolled for [employer] to mail a ZIO XT long term holter monitor to the patients address on file.  

## 2022-06-05 NOTE — Patient Instructions (Signed)
Medication Instructions:  Your physician recommends that you continue on your current medications as directed. Please refer to the Current Medication list given to you today.  *If you need a refill on your cardiac medications before your next appointment, please call your pharmacy*  Testing/Procedures: Your physician has requested that you have an echocardiogram. Echocardiography is a painless test that uses sound waves to create images of your heart. It provides your doctor with information about the size and shape of your heart and how well your heart's chambers and valves are working. This procedure takes approximately one hour. There are no restrictions for this procedure. Please do NOT wear cologne, perfume, aftershave, or lotions (deodorant is allowed). Please arrive 15 minutes prior to your appointment time.  ZIO XT- Long Term Monitor Instructions   Your physician has requested you wear a ZIO patch monitor for _7_ days.  This is a single patch monitor.   [employer] supplies one patch monitor per enrollment. Additional stickers are not available. Please do not apply patch if you will be having a Nuclear Stress Test, Echocardiogram, Cardiac CT, MRI, or Chest Xray during the period you would be wearing the monitor. The patch cannot be worn during these tests. You cannot remove and re-apply the ZIO XT patch monitor.  Your ZIO patch monitor will be sent Fed Ex from [employer] directly to your home address. It may take 3-5 days to receive your monitor after you have been enrolled.  Once you have received your monitor, please review the enclosed instructions. Your monitor has already been registered assigning a specific monitor serial # to you.  Billing and Patient Assistance Program Information   We have supplied [employer] with any of your insurance information on file for billing purposes. [employer] offers a sliding scale Patient Assistance Program for patients that do not have insurance, or  whose insurance does not completely cover the cost of the ZIO monitor.   You must apply for the Patient Assistance Program to qualify for this discounted rate.     To apply, please call [employer] at [phone], select option 4, then select option 2, and ask to apply for Patient Assistance Program.  [employer] will ask your household income, and how many people are in your household.  They will quote your out-of-pocket cost based on that information.  [employer] will also be able to set up a 12-month, interest-free payment plan if needed.  Applying the monitor   Shave hair from upper left chest.  Hold abrader disc by orange tab. Rub abrader in 40 strokes over the upper left chest as indicated in your monitor instructions.  Clean area with 4 enclosed alcohol pads. Let dry.  Apply patch as indicated in monitor instructions. Patch will be placed under collarbone on left side of chest with arrow pointing upward.  Rub patch adhesive wings for 2 minutes. Remove white label marked "1". Remove the white label marked "2". Rub patch adhesive wings for 2 additional minutes.  While looking in a mirror, press and release button in center of patch. A small green light will flash 3-4 times. This will be your only indicator that the monitor has been turned on. ?  Do not shower for the first 24 hours. You may shower after the first 24 hours.  Press the button if you feel a symptom. You will hear a small click. Record Date, Time and Symptom in the Patient Logbook.  When you are ready to remove the patch, follow instructions on the last   2 pages of the Patient Logbook. Stick patch monitor onto the last page of Patient Logbook.  Place Patient Logbook in the blue and white box.  Use locking tab on box and tape box closed securely.  The blue and white box has prepaid postage on it. Please place it in the mailbox as soon as possible. Your physician should have your test results approximately 7 days after the monitor has been mailed  back to [employer].  Call [employer] Customer Care at [phone] if you have questions regarding your ZIO XT patch monitor. Call them immediately if you see an orange light blinking on your monitor.  If your monitor falls off in less than 4 days, contact our Monitor department at [phone]. ?If your monitor becomes loose or falls off after 4 days call [employer] at [phone] for suggestions on securing your monitor.?  Follow-Up: At [facility], you and your health needs are our priority.  As part of our continuing mission to provide you with exceptional heart care, we have created designated Provider Care Teams.  These Care Teams include your primary Cardiologist (physician) and Advanced Practice Providers (APPs -  Physician Assistants and Nurse Practitioners) who all work together to provide you with the care you need, when you need it.  We recommend signing up for the patient portal called "MyChart".  Sign up information is provided on this After Visit Summary.  MyChart is used to connect with patients for Virtual Visits (Telemedicine).  Patients are able to view lab/test results, encounter notes, upcoming appointments, etc.  Non-urgent messages can be sent to your provider as well.   To learn more about what you can do with MyChart, go to [email or web address].    Your next appointment:   6 month(s)  Provider:   Dr. Schumann  

## 2022-06-05 NOTE — Progress Notes (Signed)
Cardiology Office Note:    Date:  06/05/2022   ID:  Leslie Freeman, DOB 28-May-1960, MRN [id number]  PCP:  Marcelle Overlie, MD  Cardiologist:  None  Electrophysiologist:  None   Referring MD: Marcelle Overlie, MD   Chief Complaint  Patient presents with   Heart Murmur    History of Present Illness:    Leslie Freeman is a 62 y.o. female with a hx of diabetes who is referred by Dr. Vincente Poli for evaluation of heart murmur.  Denies any chest pain, dyspnea, lightheadedness, syncope, lower extremity edema, or palpitations.  Went hiking a few weeks ago, hiked for couple miles, denies any exertional symptoms.  Reports heart rate tends to run fast.  No smoking history.  No history of heart disease in her immediate family.   No past medical history on file.  No past surgical history on file.  Current Medications: Current Meds  Medication Sig   glucose blood test strip One Touch Ultra strips   Metformin HCl 500 MG/5ML SOLN Take 500 mg by mouth in the morning and at bedtime.   Misc Natural Products (OSTEO BI-FLEX ADV JOINT SHIELD PO) Osteo Bi-Flex Adv Joint Shield   Multiple Vitamins-Minerals (MULTI FOR HER 50+ PO) as directed Orally   rosuvastatin (CRESTOR) 10 MG tablet Take 10 mg by mouth daily.     Allergies:   Penicillins   Social History   Socioeconomic History   Marital status: Widowed    Spouse name: Not on file   Number of children: Not on file   Years of education: Not on file   Highest education level: Not on file  Occupational History   Not on file  Tobacco Use   Smoking status: Never   Smokeless tobacco: Not on file  Substance and Sexual Activity   Alcohol use: Yes    Alcohol/week: 1.0 standard drink of alcohol    Types: 1 Glasses of wine per week   Drug use: Not on file   Sexual activity: Not on file  Other Topics Concern   Not on file  Social History Narrative   Not on file   Social Determinants of Health   Financial Resource Strain: Not on file   Food Insecurity: Not on file  Transportation Needs: Not on file  Physical Activity: Not on file  Stress: Not on file  Social Connections: Not on file     Family History: No family history of heart disease  ROS:   Please see the history of present illness.     All other systems reviewed and are negative.  EKGs/Labs/Other Studies Reviewed:    The following studies were reviewed today:   EKG:   06/05/2022: Sinus tachycardia, rate 128, no ST abnormalities, low-voltage  Recent Labs: No results found for requested labs within last 365 days.  Recent Lipid Panel No results found for: "CHOL", "TRIG", "HDL", "CHOLHDL", "VLDL", "LDLCALC", "LDLDIRECT"  Physical Exam:    VS:  BP 118/64 (BP Location: Left Arm, Patient Position: Sitting, Cuff Size: Large)   Pulse (!) 128   Ht 5\' 7"  (1.702 m)   Wt 174 lb 3.2 oz (79 kg)   SpO2 96%   BMI 27.28 kg/m     Wt Readings from Last 3 Encounters:  06/05/22 174 lb 3.2 oz (79 kg)  11/03/11 175 lb (79.4 kg)  04/19/11 176 lb 9.6 oz (80.1 kg)     GEN:  Well nourished, well developed in no acute distress HEENT: Normal NECK: No JVD;  No carotid bruits LYMPHATICS: No lymphadenopathy CARDIAC: RRR, 2 out of 6 systolic murmur, loudest at RUSB RESPIRATORY:  Clear to auscultation without rales, wheezing or rhonchi  ABDOMEN: Soft, non-tender, non-distended MUSCULOSKELETAL:  No edema; No deformity  SKIN: Warm and dry NEUROLOGIC:  Alert and oriented x 3 PSYCHIATRIC:  Normal affect   ASSESSMENT:    1. Heart murmur   2. Tachycardia   3. Hyperlipidemia, unspecified hyperlipidemia type    PLAN:    Heart murmur: 2/6 systolic murmur on exam, will check echocardiogram  Tachycardia: Evaluate with Zio patch x 7 days  T2DM: A1c 6.8% on 05/29/2021.  On metformin  Hyperlipidemia: LDL 55 on 05/02/2022.  On rosuvastatin 10 mg daily   RTC in 6 months   Medication Adjustments/Labs and Tests Ordered: Current medicines are reviewed at length with the  patient today.  Concerns regarding medicines are outlined above.  Orders Placed This Encounter  Procedures   LONG TERM MONITOR (3-14 DAYS)   EKG 12-Lead   ECHOCARDIOGRAM COMPLETE   No orders of the defined types were placed in this encounter.   Patient Instructions  Medication Instructions:  Your physician recommends that you continue on your current medications as directed. Please refer to the Current Medication list given to you today.  *If you need a refill on your cardiac medications before your next appointment, please call your pharmacy*  Testing/Procedures: Your physician has requested that you have an echocardiogram. Echocardiography is a painless test that uses sound waves to create images of your heart. It provides your doctor with information about the size and shape of your heart and how well your heart's chambers and valves are working. This procedure takes approximately one hour. There are no restrictions for this procedure. Please do NOT wear cologne, perfume, aftershave, or lotions (deodorant is allowed). Please arrive 15 minutes prior to your appointment time.  ZIO XT- Long Term Monitor Instructions   Your physician has requested you wear a ZIO patch monitor for _7_ days.  This is a single patch monitor.   [employer] supplies one patch monitor per enrollment. Additional stickers are not available. Please do not apply patch if you will be having a Nuclear Stress Test, Echocardiogram, Cardiac CT, MRI, or Chest Xray during the period you would be wearing the monitor. The patch cannot be worn during these tests. You cannot remove and re-apply the ZIO XT patch monitor.  Your ZIO patch monitor will be sent Fed Ex from [employer] directly to your home address. It may take 3-5 days to receive your monitor after you have been enrolled.  Once you have received your monitor, please review the enclosed instructions. Your monitor has already been registered assigning a specific  monitor serial # to you.  Billing and Patient Assistance Program Information   We have supplied [employer] with any of your insurance information on file for billing purposes. [employer] offers a sliding scale Patient Assistance Program for patients that do not have insurance, or whose insurance does not completely cover the cost of the ZIO monitor.   You must apply for the Patient Assistance Program to qualify for this discounted rate.     To apply, please call [employer] at [phone], select option 4, then select option 2, and ask to apply for Patient Assistance Program.  Meredeth Ide will ask your household income, and how many people are in your household.  They will quote your out-of-pocket cost based on that information.  [employer] will also be able to set up a  20-month, interest-free payment plan if needed.  Applying the monitor   Shave hair from upper left chest.  Hold abrader disc by orange tab. Rub abrader in 40 strokes over the upper left chest as indicated in your monitor instructions.  Clean area with 4 enclosed alcohol pads. Let dry.  Apply patch as indicated in monitor instructions. Patch will be placed under collarbone on left side of chest with arrow pointing upward.  Rub patch adhesive wings for 2 minutes. Remove white label marked "1". Remove the white label marked "2". Rub patch adhesive wings for 2 additional minutes.  While looking in a mirror, press and release button in center of patch. A small green light will flash 3-4 times. This will be your only indicator that the monitor has been turned on. ?  Do not shower for the first 24 hours. You may shower after the first 24 hours.  Press the button if you feel a symptom. You will hear a small click. Record Date, Time and Symptom in the Patient Logbook.  When you are ready to remove the patch, follow instructions on the last 2 pages of the Patient Logbook. Stick patch monitor onto the last page of Patient Logbook.  Place Patient Logbook in  the blue and white box.  Use locking tab on box and tape box closed securely.  The blue and white box has prepaid postage on it. Please place it in the mailbox as soon as possible. Your physician should have your test results approximately 7 days after the monitor has been mailed back to [facility].  Call [facility] Customer Care at [phone] if you have questions regarding your ZIO XT patch monitor. Call them immediately if you see an orange light blinking on your monitor.  If your monitor falls off in less than 4 days, contact our Monitor department at [phone]. ?If your monitor becomes loose or falls off after 4 days call [employer] at [phone] for suggestions on securing your monitor.?  Follow-Up: At [facility], you and your health needs are our priority.  As part of our continuing mission to provide you with exceptional heart care, we have created designated Provider Care Teams.  These Care Teams include your primary Cardiologist (physician) and Advanced Practice Providers (APPs -  Physician Assistants and Nurse Practitioners) who all work together to provide you with the care you need, when you need it.  We recommend signing up for the patient portal called "MyChart".  Sign up information is provided on this After Visit Summary.  MyChart is used to connect with patients for Virtual Visits (Telemedicine).  Patients are able to view lab/test results, encounter notes, upcoming appointments, etc.  Non-urgent messages can be sent to your provider as well.   To learn more about what you can do with MyChart, go to [email or web address].    Your next appointment:   6 month(s)  Provider:   Dr. Bjorn Pippin    Signed, Little Ishikawa, MD  06/05/2022 2:17 PM    [facility]

## 2022-06-09 ENCOUNTER — Ambulatory Visit
Admission: RE | Admit: 2022-06-09 | Discharge: 2022-06-09 | Disposition: A | Discharge status: Home / Self Care | Payer: 59 | Source: Ambulatory Visit | Attending: Endocrinology | Admitting: Endocrinology

## 2022-06-09 DIAGNOSIS — E049 Nontoxic goiter, unspecified: Secondary | ICD-10-CM | POA: Diagnosis not present

## 2022-06-10 DIAGNOSIS — R000 Tachycardia, unspecified: Secondary | ICD-10-CM

## 2022-06-12 ENCOUNTER — Other Ambulatory Visit: Payer: Self-pay | Admitting: Endocrinology

## 2022-06-12 DIAGNOSIS — E049 Nontoxic goiter, unspecified: Secondary | ICD-10-CM

## 2022-06-13 ENCOUNTER — Other Ambulatory Visit: Payer: Self-pay | Admitting: Endocrinology

## 2022-06-13 DIAGNOSIS — E049 Nontoxic goiter, unspecified: Secondary | ICD-10-CM

## 2022-06-21 DIAGNOSIS — R000 Tachycardia, unspecified: Secondary | ICD-10-CM | POA: Diagnosis not present

## 2022-07-02 ENCOUNTER — Ambulatory Visit: Payer: 59 | Attending: Cardiology

## 2022-07-02 DIAGNOSIS — R011 Cardiac murmur, unspecified: Secondary | ICD-10-CM | POA: Diagnosis not present

## 2022-07-02 LAB — ECHOCARDIOGRAM COMPLETE
AR max vel: 1.13 cm2
AV Area VTI: 1.05 cm2
AV Area mean vel: 1.1 cm2
AV Mean grad: 10 mmHg
AV Peak grad: 18.6 mmHg
Ao pk vel: 2.16 m/s
Area-P 1/2: 5.97 cm2
S' Lateral: 3 cm

## 2022-07-02 MED ORDER — PERFLUTREN LIPID MICROSPHERE
1.0000 mL | INTRAVENOUS | Status: AC | PRN
Start: 2022-07-02 — End: 2022-07-02
  Administered 2022-07-02: 2 mL via INTRAVENOUS

## 2022-07-11 ENCOUNTER — Ambulatory Visit
Admission: RE | Admit: 2022-07-11 | Discharge: 2022-07-11 | Disposition: A | Discharge status: Home / Self Care | Payer: PRIVATE HEALTH INSURANCE | Source: Ambulatory Visit | Attending: Endocrinology | Admitting: Endocrinology

## 2022-07-11 ENCOUNTER — Other Ambulatory Visit (HOSPITAL_COMMUNITY)
Admission: RE | Admit: 2022-07-11 | Discharge: 2022-07-11 | Disposition: A | Discharge status: Home / Self Care | Payer: 59 | Source: Ambulatory Visit | Attending: Interventional Radiology | Admitting: Interventional Radiology

## 2022-07-11 DIAGNOSIS — E049 Nontoxic goiter, unspecified: Secondary | ICD-10-CM | POA: Insufficient documentation

## 2022-07-11 DIAGNOSIS — E041 Nontoxic single thyroid nodule: Secondary | ICD-10-CM | POA: Diagnosis not present

## 2022-07-14 LAB — CYTOLOGY - NON PAP

## 2022-07-17 DIAGNOSIS — Z13820 Encounter for screening for osteoporosis: Secondary | ICD-10-CM | POA: Diagnosis not present

## 2022-09-15 DIAGNOSIS — E7800 Pure hypercholesterolemia, unspecified: Secondary | ICD-10-CM | POA: Diagnosis not present

## 2022-09-15 DIAGNOSIS — E049 Nontoxic goiter, unspecified: Secondary | ICD-10-CM | POA: Diagnosis not present

## 2022-09-15 DIAGNOSIS — E1165 Type 2 diabetes mellitus with hyperglycemia: Secondary | ICD-10-CM | POA: Diagnosis not present

## 2022-09-15 DIAGNOSIS — M810 Age-related osteoporosis without current pathological fracture: Secondary | ICD-10-CM | POA: Diagnosis not present

## 2022-09-15 DIAGNOSIS — R030 Elevated blood-pressure reading, without diagnosis of hypertension: Secondary | ICD-10-CM | POA: Diagnosis not present

## 2022-12-09 ENCOUNTER — Ambulatory Visit: Payer: PRIVATE HEALTH INSURANCE | Admitting: Cardiology

## 2022-12-14 NOTE — Progress Notes (Unsigned)
Cardiology Office Note:    Date:  12/16/2022   ID:  [name], DOB May 13, 1960, MRN [id number]  PCP:  Marcelle Overlie, MD  Cardiologist:  None  Electrophysiologist:  None   Referring MD: Marcelle Overlie, MD   Chief Complaint  Patient presents with   Aortic Stenosis    History of Present Illness:    Leslie Freeman is a 62 y.o. female with a hx of diabetes who presents for follow-up.  She was referred by Dr. Vincente Poli for evaluation of heart murmur, initially seen 06/05/2022.  Echocardiogram 07/02/2022 showed EF 60 to 65%, normal RV function, mild aortic stenosis.  Zio patch x 7 days 06/2022 showed 4 episodes of SVT with longest lasting 11.5 seconds with average rate 154 bpm.  Since last clinic visit, she reports she is doing well.  Denies any chest pain, dyspnea, lightheadedness, syncope, lower extremity edema, or palpitations.  Walks 3-4 days per week for 30 minutes.   No past medical history on file.  No past surgical history on file.  Current Medications: Current Meds  Medication Sig   glucose blood test strip One Touch Ultra strips   Metformin HCl 500 MG/5ML SOLN Take 500 mg by mouth in the morning and at bedtime.   Misc Natural Products (OSTEO BI-FLEX ADV JOINT SHIELD PO) Osteo Bi-Flex Adv Joint Shield   Multiple Vitamins-Minerals (MULTI FOR HER 50+ PO) as directed Orally   rosuvastatin (CRESTOR) 10 MG tablet Take 10 mg by mouth daily.     Allergies:   Penicillins   Social History   Socioeconomic History   Marital status: Widowed    Spouse name: Not on file   Number of children: Not on file   Years of education: Not on file   Highest education level: Not on file  Occupational History   Not on file  Tobacco Use   Smoking status: Never   Smokeless tobacco: Not on file  Substance and Sexual Activity   Alcohol use: Yes    Alcohol/week: 1.0 standard drink of alcohol    Types: 1 Glasses of wine per week   Drug use: Not on file   Sexual activity: Not on  file  Other Topics Concern   Not on file  Social History Narrative   Not on file   Social Determinants of Health   Financial Resource Strain: Not on file  Food Insecurity: Not on file  Transportation Needs: Not on file  Physical Activity: Not on file  Stress: Not on file  Social Connections: Not on file     Family History: No family history of heart disease  ROS:   Please see the history of present illness.     All other systems reviewed and are negative.  EKGs/Labs/Other Studies Reviewed:    The following studies were reviewed today:   EKG:   06/05/2022: Sinus tachycardia, rate 128, no ST abnormalities, low-voltage  Recent Labs: No results found for requested labs within last 365 days.  Recent Lipid Panel No results found for: "CHOL", "TRIG", "HDL", "CHOLHDL", "VLDL", "LDLCALC", "LDLDIRECT"  Physical Exam:    VS:  BP 100/70 (BP Location: Left Arm, Patient Position: Sitting, Cuff Size: Normal)   Pulse (!) 120   Ht 5\' 7"  (1.702 m)   Wt 172 lb (78 kg)   SpO2 97%   BMI 26.94 kg/m     Wt Readings from Last 3 Encounters:  12/16/22 172 lb (78 kg)  06/05/22 174 lb 3.2 oz (79 kg)  11/03/11  175 lb (79.4 kg)     GEN:  Well nourished, well developed in no acute distress HEENT: Normal NECK: No JVD; No carotid bruits LYMPHATICS: No lymphadenopathy CARDIAC: RRR, 2 out of 6 systolic murmur, loudest at RUSB RESPIRATORY:  Clear to auscultation without rales, wheezing or rhonchi  ABDOMEN: Soft, non-tender, non-distended MUSCULOSKELETAL:  No edema; No deformity  SKIN: Warm and dry NEUROLOGIC:  Alert and oriented x 3 PSYCHIATRIC:  Normal affect   ASSESSMENT:    1. Aortic valve stenosis, etiology of cardiac valve disease unspecified   2. Tachycardia   3. Hyperlipidemia, unspecified hyperlipidemia type     PLAN:    Aortic stenosis: Echocardiogram 07/02/2022 showed EF 60 to 65%, normal RV function, mild aortic stenosis.   -Will monitor, plan repeat echocardiogram in 2  years  Tachycardia: Zio patch x 7 days 06/2022 showed 4 episodes of SVT with longest lasting 11.5 seconds with average rate 154 bpm.  No symptoms reported  T2DM: A1c 7.2% on 09/2022.  On metformin  Hyperlipidemia: LDL 55 on 05/02/2022.  On rosuvastatin 10 mg daily   RTC in 1 year   Medication Adjustments/Labs and Tests Ordered: Current medicines are reviewed at length with the patient today.  Concerns regarding medicines are outlined above.  No orders of the defined types were placed in this encounter.  No orders of the defined types were placed in this encounter.   Patient Instructions  Medication Instructions:  Continue current medications *If you need a refill on your cardiac medications before your next appointment, please call your pharmacy*   Lab Work: none If you have labs (blood work) drawn today and your tests are completely normal, you will receive your results only by: MyChart Message (if you have MyChart) OR A paper copy in the mail If you have any lab test that is abnormal or we need to change your treatment, we will call you to review the results.   Testing/Procedures: none   Follow-Up: At [facility], you and your health needs are our priority.  As part of our continuing mission to provide you with exceptional heart care, we have created designated Provider Care Teams.  These Care Teams include your primary Cardiologist (physician) and Advanced Practice Providers (APPs -  Physician Assistants and Nurse Practitioners) who all work together to provide you with the care you need, when you need it.  We recommend signing up for the patient portal called "MyChart".  Sign up information is provided on this After Visit Summary.  MyChart is used to connect with patients for Virtual Visits (Telemedicine).  Patients are able to view lab/test results, encounter notes, upcoming appointments, etc.  Non-urgent messages can be sent to your provider as well.   To learn  more about what you can do with MyChart, go to [email or web address].    Your next appointment:   1 year(s)  Provider:   Dr. Bjorn Pippin     Signed, Little Ishikawa, MD  12/16/2022 1:35 PM    [facility]

## 2022-12-16 ENCOUNTER — Ambulatory Visit: Payer: 59 | Attending: Cardiology | Admitting: Cardiology

## 2022-12-16 ENCOUNTER — Encounter: Payer: Self-pay | Admitting: Cardiology

## 2022-12-16 VITALS — BP 100/70 | HR 120 | Ht 67.0 in | Wt 172.0 lb

## 2022-12-16 DIAGNOSIS — E785 Hyperlipidemia, unspecified: Secondary | ICD-10-CM | POA: Diagnosis not present

## 2022-12-16 DIAGNOSIS — I350 Nonrheumatic aortic (valve) stenosis: Secondary | ICD-10-CM

## 2022-12-16 DIAGNOSIS — R000 Tachycardia, unspecified: Secondary | ICD-10-CM

## 2022-12-16 NOTE — Patient Instructions (Signed)
Medication Instructions:  Continue current medications *If you need a refill on your cardiac medications before your next appointment, please call your pharmacy*   Lab Work: none If you have labs (blood work) drawn today and your tests are completely normal, you will receive your results only by: MyChart Message (if you have MyChart) OR A paper copy in the mail If you have any lab test that is abnormal or we need to change your treatment, we will call you to review the results.   Testing/Procedures: none   Follow-Up: At [facility], you and your health needs are our priority.  As part of our continuing mission to provide you with exceptional heart care, we have created designated Provider Care Teams.  These Care Teams include your primary Cardiologist (physician) and Advanced Practice Providers (APPs -  Physician Assistants and Nurse Practitioners) who all work together to provide you with the care you need, when you need it.  We recommend signing up for the patient portal called "MyChart".  Sign up information is provided on this After Visit Summary.  MyChart is used to connect with patients for Virtual Visits (Telemedicine).  Patients are able to view lab/test results, encounter notes, upcoming appointments, etc.  Non-urgent messages can be sent to your provider as well.   To learn more about what you can do with MyChart, go to [email or web address].    Your next appointment:   1 year(s)  Provider:   Dr. Bjorn Pippin

## 2023-03-16 DIAGNOSIS — E049 Nontoxic goiter, unspecified: Secondary | ICD-10-CM | POA: Diagnosis not present

## 2023-03-16 DIAGNOSIS — E1165 Type 2 diabetes mellitus with hyperglycemia: Secondary | ICD-10-CM | POA: Diagnosis not present

## 2023-03-16 DIAGNOSIS — E7800 Pure hypercholesterolemia, unspecified: Secondary | ICD-10-CM | POA: Diagnosis not present

## 2023-03-23 DIAGNOSIS — M810 Age-related osteoporosis without current pathological fracture: Secondary | ICD-10-CM | POA: Diagnosis not present

## 2023-03-23 DIAGNOSIS — R030 Elevated blood-pressure reading, without diagnosis of hypertension: Secondary | ICD-10-CM | POA: Diagnosis not present

## 2023-03-23 DIAGNOSIS — E1165 Type 2 diabetes mellitus with hyperglycemia: Secondary | ICD-10-CM | POA: Diagnosis not present

## 2023-03-23 DIAGNOSIS — E7800 Pure hypercholesterolemia, unspecified: Secondary | ICD-10-CM | POA: Diagnosis not present

## 2023-03-23 DIAGNOSIS — E049 Nontoxic goiter, unspecified: Secondary | ICD-10-CM | POA: Diagnosis not present

## 2023-07-21 DIAGNOSIS — E1165 Type 2 diabetes mellitus with hyperglycemia: Secondary | ICD-10-CM | POA: Diagnosis not present

## 2023-07-28 DIAGNOSIS — E1165 Type 2 diabetes mellitus with hyperglycemia: Secondary | ICD-10-CM | POA: Diagnosis not present

## 2023-07-28 DIAGNOSIS — E049 Nontoxic goiter, unspecified: Secondary | ICD-10-CM | POA: Diagnosis not present

## 2023-07-28 DIAGNOSIS — E7800 Pure hypercholesterolemia, unspecified: Secondary | ICD-10-CM | POA: Diagnosis not present

## 2023-07-28 DIAGNOSIS — R030 Elevated blood-pressure reading, without diagnosis of hypertension: Secondary | ICD-10-CM | POA: Diagnosis not present

## 2023-07-28 DIAGNOSIS — M810 Age-related osteoporosis without current pathological fracture: Secondary | ICD-10-CM | POA: Diagnosis not present

## 2023-12-14 NOTE — Progress Notes (Unsigned)
 Cardiology Office Note:    Date:  12/16/2023   ID:  Leslie Freeman, DOB 20-Sep-1960, MRN [id number]  PCP:  Mat Browning, MD  Cardiologist:  None  Electrophysiologist:  None   Referring MD: Mat Browning, MD   Chief Complaint  Patient presents with   Aortic Stenosis    History of Present Illness:    Leslie Freeman is a 63 y.o. female with a hx of diabetes, aortic stenosis who presents for follow-up.  She was referred by Dr. Mat for evaluation of heart murmur, initially seen 06/05/2022.  Echocardiogram 07/02/2022 showed EF 60 to 65%, normal RV function, mild aortic stenosis.  Zio patch x 7 days 06/2022 showed 4 episodes of SVT with longest lasting 11.5 seconds with average rate 154 bpm.  Since last clinic visit,  she reports she is doing well.  Denies any chest pain, dyspnea, lightheadedness, syncope, lower extremity edema, or palpitations.  She goes for walks 3-4 days per week for about 30 minutes.  Denies any exertional symptoms.  History reviewed. No pertinent past medical history.  History reviewed. No pertinent surgical history.  Current Medications: Current Meds  Medication Sig   Cholecalciferol 50 MCG (2000 UT) CAPS Take 1 capsule by mouth daily at 6 (six) AM.   glucose blood test strip One Touch Ultra strips   Metformin HCl 500 MG/5ML SOLN Take 500 mg by mouth in the morning and at bedtime.   Misc Natural Products (OSTEO BI-FLEX ADV JOINT SHIELD PO) Osteo Bi-Flex Adv Joint Shield   Multiple Vitamins-Minerals (MULTI FOR HER 50+ PO) as directed Orally   rosuvastatin (CRESTOR) 10 MG tablet Take 10 mg by mouth daily.     Allergies:   Penicillins   Social History   Socioeconomic History   Marital status: Widowed    Spouse name: Not on file   Number of children: Not on file   Years of education: Not on file   Highest education level: Not on file  Occupational History   Not on file  Tobacco Use   Smoking status: Never   Smokeless tobacco: Not on file   Substance and Sexual Activity   Alcohol use: Yes    Alcohol/week: 1.0 standard drink of alcohol    Types: 1 Glasses of wine per week   Drug use: Not on file   Sexual activity: Not on file  Other Topics Concern   Not on file  Social History Narrative   Not on file   Social Drivers of Health   Financial Resource Strain: Not on file  Food Insecurity: Not on file  Transportation Needs: Not on file  Physical Activity: Not on file  Stress: Not on file  Social Connections: Not on file     Family History: No family history of heart disease  ROS:   Please see the history of present illness.     All other systems reviewed and are negative.  EKGs/Labs/Other Studies Reviewed:    The following studies were reviewed today:   EKG:   06/05/2022: Sinus tachycardia, rate 128, no ST abnormalities, low-voltage 12/16/2023: Sinus tach cardia, rate 103, no ST abnormalities  Recent Labs: No results found for requested labs within last 365 days.  Recent Lipid Panel No results found for: CHOL, TRIG, HDL, CHOLHDL, VLDL, LDLCALC, LDLDIRECT  Physical Exam:    VS:  BP 135/75   Pulse (!) 103   Ht 5' 7 (1.702 m)   Wt 171 lb (77.6 kg)   SpO2 97%  BMI 26.78 kg/m     Wt Readings from Last 3 Encounters:  12/16/23 171 lb (77.6 kg)  12/16/22 172 lb (78 kg)  06/05/22 174 lb 3.2 oz (79 kg)     GEN:  Well nourished, well developed in no acute distress HEENT: Normal NECK: No JVD; No carotid bruits LYMPHATICS: No lymphadenopathy CARDIAC: RRR, 2 out of 6 systolic murmur, loudest at RUSB RESPIRATORY:  Clear to auscultation without rales, wheezing or rhonchi  ABDOMEN: Soft, non-tender, non-distended MUSCULOSKELETAL:  No edema; No deformity  SKIN: Warm and dry NEUROLOGIC:  Alert and oriented x 3 PSYCHIATRIC:  Normal affect   ASSESSMENT:    1. Aortic valve stenosis, etiology of cardiac valve disease unspecified   2. Tachycardia   3. Hyperlipidemia, unspecified hyperlipidemia  type      PLAN:    Aortic stenosis: Echocardiogram 07/02/2022 showed EF 60 to 65%, normal RV function, mild aortic stenosis.   -Will monitor, plan repeat echocardiogram in 2 years (06/2024)  Tachycardia: Zio patch x 7 days 06/2022 showed 4 episodes of SVT with longest lasting 11.5 seconds with average rate 154 bpm.  No symptoms reported  T2DM: A1c 7.2% on 09/2022.  On metformin and glimepiride.  A1c has significantly improved, most recently 6.0% on 07/2023.  Hyperlipidemia: LDL 69 on 03/16/23.  On rosuvastatin 10 mg daily   RTC in 1 year   Medication Adjustments/Labs and Tests Ordered: Current medicines are reviewed at length with the patient today.  Concerns regarding medicines are outlined above.  Orders Placed This Encounter  Procedures   EKG 12-Lead   ECHOCARDIOGRAM COMPLETE   No orders of the defined types were placed in this encounter.   Patient Instructions  Medication Instructions:  Your physician recommends that you continue on your current medications as directed. Please refer to the Current Medication list given to you today. *If you need a refill on your cardiac medications before your next appointment, please call your pharmacy*  Lab Work: None ordered If you have labs (blood work) drawn today and your tests are completely normal, you will receive your results only by: MyChart Message (if you have MyChart) OR A paper copy in the mail If you have any lab test that is abnormal or we need to change your treatment, we will call you to review the results.  Testing/Procedures: Your physician has requested that you have an echocardiogram. Echocardiography is a painless test that uses sound waves to create images of your heart. It provides your doctor with information about the size and shape of your heart and how well your heart's chambers and valves are working. This procedure takes approximately one hour. There are no restrictions for this procedure. Please do NOT wear  cologne, perfume, aftershave, or lotions (deodorant is allowed). Please arrive 15 minutes prior to your appointment time.  Please note: We ask at that you not bring children with you during ultrasound (echo/ vascular) testing. Due to room size and safety concerns, children are not allowed in the ultrasound rooms during exams. Our front office staff cannot provide observation of children in our lobby area while testing is being conducted. An adult accompanying a patient to their appointment will only be allowed in the ultrasound room at the discretion of the ultrasound technician under special circumstances. We apologize for any inconvenience.  Follow-Up: At [facility], you and your health needs are our priority.  As part of our continuing mission to provide you with exceptional heart care, our providers are all part  of one team.  This team includes your primary Cardiologist (physician) and Advanced Practice Providers or APPs (Physician Assistants and Nurse Practitioners) who all work together to provide you with the care you need, when you need it.  Your next appointment:   12 month(s)  Provider:   Lonni Nanas, MD or ANY APP   We recommend signing up for the patient portal called MyChart.  Sign up information is provided on this After Visit Summary.  MyChart is used to connect with patients for Virtual Visits (Telemedicine).  Patients are able to view lab/test results, encounter notes, upcoming appointments, etc.  Non-urgent messages can be sent to your provider as well.   To learn more about what you can do with MyChart, go to [email or web address].   Other Instructions            Signed, Lonni LITTIE Nanas, MD  12/16/2023 5:04 PM    [facility]

## 2023-12-16 ENCOUNTER — Ambulatory Visit: Attending: Student in an Organized Health Care Education/Training Program | Admitting: Cardiology

## 2023-12-16 ENCOUNTER — Encounter: Payer: Self-pay | Admitting: Cardiology

## 2023-12-16 VITALS — BP 135/75 | HR 103 | Ht 67.0 in | Wt 171.0 lb

## 2023-12-16 DIAGNOSIS — R000 Tachycardia, unspecified: Secondary | ICD-10-CM

## 2023-12-16 DIAGNOSIS — E785 Hyperlipidemia, unspecified: Secondary | ICD-10-CM

## 2023-12-16 DIAGNOSIS — I350 Nonrheumatic aortic (valve) stenosis: Secondary | ICD-10-CM

## 2023-12-16 NOTE — Patient Instructions (Signed)
 Medication Instructions:  Your physician recommends that you continue on your current medications as directed. Please refer to the Current Medication list given to you today. *If you need a refill on your cardiac medications before your next appointment, please call your pharmacy*  Lab Work: None ordered If you have labs (blood work) drawn today and your tests are completely normal, you will receive your results only by: MyChart Message (if you have MyChart) OR A paper copy in the mail If you have any lab test that is abnormal or we need to change your treatment, we will call you to review the results.  Testing/Procedures: Your physician has requested that you have an echocardiogram. Echocardiography is a painless test that uses sound waves to create images of your heart. It provides your doctor with information about the size and shape of your heart and how well your heart's chambers and valves are working. This procedure takes approximately one hour. There are no restrictions for this procedure. Please do NOT wear cologne, perfume, aftershave, or lotions (deodorant is allowed). Please arrive 15 minutes prior to your appointment time.  Please note: We ask at that you not bring children with you during ultrasound (echo/ vascular) testing. Due to room size and safety concerns, children are not allowed in the ultrasound rooms during exams. Our front office staff cannot provide observation of children in our lobby area while testing is being conducted. An adult accompanying a patient to their appointment will only be allowed in the ultrasound room at the discretion of the ultrasound technician under special circumstances. We apologize for any inconvenience.  Follow-Up: At [facility], you and your health needs are our priority.  As part of our continuing mission to provide you with exceptional heart care, our providers are all part of one team.  This team includes your primary Cardiologist  (physician) and Advanced Practice Providers or APPs (Physician Assistants and Nurse Practitioners) who all work together to provide you with the care you need, when you need it.  Your next appointment:   12 month(s)  Provider:   Lonni Nanas, MD or ANY APP   We recommend signing up for the patient portal called MyChart.  Sign up information is provided on this After Visit Summary.  MyChart is used to connect with patients for Virtual Visits (Telemedicine).  Patients are able to view lab/test results, encounter notes, upcoming appointments, etc.  Non-urgent messages can be sent to your provider as well.   To learn more about what you can do with MyChart, go to [email or web address].   Other Instructions

## 2024-01-26 DIAGNOSIS — E049 Nontoxic goiter, unspecified: Secondary | ICD-10-CM | POA: Diagnosis not present

## 2024-01-26 DIAGNOSIS — E7800 Pure hypercholesterolemia, unspecified: Secondary | ICD-10-CM | POA: Diagnosis not present

## 2024-01-26 DIAGNOSIS — E1165 Type 2 diabetes mellitus with hyperglycemia: Secondary | ICD-10-CM | POA: Diagnosis not present

## 2024-06-14 ENCOUNTER — Ambulatory Visit (HOSPITAL_COMMUNITY)
